# Patient Record
Sex: Male | Born: 1940 | Race: White | Hispanic: No | Marital: Married | Smoking: Never smoker
Health system: Southern US, Community
[De-identification: ages and names within clinical notes are randomized; demographics above are authoritative.]

## PROBLEM LIST (undated history)

## (undated) DIAGNOSIS — J45909 Unspecified asthma, uncomplicated: Secondary | ICD-10-CM

## (undated) DIAGNOSIS — F329 Major depressive disorder, single episode, unspecified: Secondary | ICD-10-CM

## (undated) DIAGNOSIS — K56609 Unspecified intestinal obstruction, unspecified as to partial versus complete obstruction: Secondary | ICD-10-CM

## (undated) DIAGNOSIS — E785 Hyperlipidemia, unspecified: Secondary | ICD-10-CM

## (undated) DIAGNOSIS — F32A Depression, unspecified: Secondary | ICD-10-CM

## (undated) HISTORY — DX: Depression, unspecified: F32.A

## (undated) HISTORY — DX: Unspecified asthma, uncomplicated: J45.909

## (undated) HISTORY — DX: Unspecified intestinal obstruction, unspecified as to partial versus complete obstruction: K56.609

## (undated) HISTORY — DX: Hyperlipidemia, unspecified: E78.5

## (undated) HISTORY — DX: Major depressive disorder, single episode, unspecified: F32.9

## (undated) HISTORY — PX: TONSILLECTOMY: SUR1361

---

## 1996-12-17 HISTORY — PX: OTHER SURGICAL HISTORY: SHX169

## 2004-02-14 ENCOUNTER — Emergency Department (HOSPITAL_COMMUNITY): Admission: EM | Admit: 2004-02-14 | Discharge: 2004-02-14 | Payer: Self-pay | Admitting: Emergency Medicine

## 2004-04-17 ENCOUNTER — Encounter (INDEPENDENT_AMBULATORY_CARE_PROVIDER_SITE_OTHER): Payer: Self-pay | Admitting: *Deleted

## 2004-04-17 ENCOUNTER — Ambulatory Visit (HOSPITAL_COMMUNITY): Admission: RE | Admit: 2004-04-17 | Discharge: 2004-04-17 | Payer: Self-pay | Admitting: General Surgery

## 2004-04-17 ENCOUNTER — Ambulatory Visit (HOSPITAL_BASED_OUTPATIENT_CLINIC_OR_DEPARTMENT_OTHER): Admission: RE | Admit: 2004-04-17 | Discharge: 2004-04-17 | Payer: Self-pay | Admitting: General Surgery

## 2004-04-17 HISTORY — PX: CYST REMOVAL TRUNK: SHX6283

## 2009-01-10 ENCOUNTER — Ambulatory Visit: Payer: Self-pay | Admitting: Orthopedic Surgery

## 2009-01-18 ENCOUNTER — Inpatient Hospital Stay: Payer: Self-pay | Admitting: Orthopedic Surgery

## 2010-05-27 ENCOUNTER — Ambulatory Visit: Payer: Self-pay | Admitting: Vascular Surgery

## 2010-05-27 ENCOUNTER — Encounter (INDEPENDENT_AMBULATORY_CARE_PROVIDER_SITE_OTHER): Payer: Self-pay | Admitting: Emergency Medicine

## 2010-05-27 ENCOUNTER — Emergency Department (HOSPITAL_COMMUNITY): Admission: EM | Admit: 2010-05-27 | Discharge: 2010-05-27 | Payer: Self-pay | Admitting: Emergency Medicine

## 2011-05-04 NOTE — Op Note (Signed)
NAME:  Tim Collier, MENNENGA NO.:  [id number]   MEDICAL RECORD NO.:  [id number]                   PATIENT TYPE:  AMB   LOCATION:  DSC                                  FACILITY:  [facility]   PHYSICIAN:  Ollen Gross. Vernell Morgans, M.D.              DATE OF BIRTH:  07/07/1941   DATE OF PROCEDURE:  04/17/2004  DATE OF DISCHARGE:                                 OPERATIVE REPORT   PREOPERATIVE DIAGNOSES:  Sebaceous cyst times two on the back, approximately  2.5 cm each.   POSTOPERATIVE DIAGNOSES:  Sebaceous cyst times two on the back,  approximately 2.5 cm each.   OPERATION PERFORMED:  Excision of sebaceous cyst times two on the back.   SURGEON:  Ollen Gross. Carolynne Edouard, M.D.   ANESTHESIA:  General via LMA.   DESCRIPTION OF PROCEDURE:  After informed consent was obtained, the patient  was brought to the operating room and placed in supine position on the  operating table.  After adequate induction of general anesthesia, the  patient was placed into a left side up lateral position with the bean bag.  All pressure points were padded.  His back was then prepped with Betadine  and draped in the usual sterile manner.  Each of the areas was then excised  in an elliptical fashion all the way down to healthy tissue  circumferentially.  This was done sharply with a 15 blade knife.  Hemostasis  was achieved using the Bovie electrocautery.  The two incisions were then  closed with a combination of interrupted vertical mattress stitches as well  as interrupted stitches of 2-0 nylon.  Neosporin and sterile dressings were  applied.  The patient tolerated the procedure well.  At the end of the case  all sponge, needle and instrument counts were correct.  The patient was  awakened and taken to the recovery room in stable condition.                                               Ollen Gross. Vernell Morgans, M.D.    PST/MEDQ  D:  04/17/2004  T:  04/17/2004  Job:  [id number]

## 2012-10-08 ENCOUNTER — Ambulatory Visit
Admission: RE | Admit: 2012-10-08 | Discharge: 2012-10-08 | Disposition: A | Discharge status: Home / Self Care | Payer: Medicare Other | Source: Ambulatory Visit | Attending: Allergy | Admitting: Allergy

## 2012-10-08 ENCOUNTER — Other Ambulatory Visit: Payer: Self-pay | Admitting: Allergy

## 2012-10-08 DIAGNOSIS — J45909 Unspecified asthma, uncomplicated: Secondary | ICD-10-CM

## 2013-04-13 ENCOUNTER — Encounter: Payer: Self-pay | Admitting: Family Medicine

## 2013-04-14 ENCOUNTER — Telehealth: Payer: Self-pay | Admitting: Gastroenterology

## 2013-04-14 NOTE — Telephone Encounter (Signed)
Pts son concerned about his father, states he is not doing well and has a family history of stomach cancer. Pt is having some abdominal pain and does not have much of an appetite. Requesting he be seen sooner than 1st available due to rapid decline. Pt scheduled to see Doug Sou PA tomorrow at 1:30pm. Son to notify pt of appt date and time.

## 2013-04-15 ENCOUNTER — Encounter: Payer: Self-pay | Admitting: Gastroenterology

## 2013-04-15 ENCOUNTER — Ambulatory Visit (INDEPENDENT_AMBULATORY_CARE_PROVIDER_SITE_OTHER): Payer: Medicare Other | Admitting: Gastroenterology

## 2013-04-15 ENCOUNTER — Other Ambulatory Visit (INDEPENDENT_AMBULATORY_CARE_PROVIDER_SITE_OTHER): Payer: Medicare Other

## 2013-04-15 VITALS — BP 124/70 | HR 68 | Ht 69.0 in | Wt 187.4 lb

## 2013-04-15 DIAGNOSIS — Z1211 Encounter for screening for malignant neoplasm of colon: Secondary | ICD-10-CM

## 2013-04-15 DIAGNOSIS — R109 Unspecified abdominal pain: Secondary | ICD-10-CM

## 2013-04-15 DIAGNOSIS — Z800 Family history of malignant neoplasm of digestive organs: Secondary | ICD-10-CM

## 2013-04-15 DIAGNOSIS — R141 Gas pain: Secondary | ICD-10-CM

## 2013-04-15 DIAGNOSIS — R142 Eructation: Secondary | ICD-10-CM

## 2013-04-15 DIAGNOSIS — R143 Flatulence: Secondary | ICD-10-CM

## 2013-04-15 LAB — CBC WITH DIFFERENTIAL/PLATELET
Basophils Absolute: 0 10*3/uL (ref 0.0–0.1)
Eosinophils Absolute: 0.5 10*3/uL (ref 0.0–0.7)
Lymphocytes Relative: 25.4 % (ref 12.0–46.0)
MCHC: 34.3 g/dL (ref 30.0–36.0)
Platelets: 316 10*3/uL (ref 150.0–400.0)
WBC: 6.9 10*3/uL (ref 4.5–10.5)

## 2013-04-15 LAB — COMPREHENSIVE METABOLIC PANEL
AST: 36 U/L (ref 0–37)
Alkaline Phosphatase: 80 U/L (ref 39–117)
BUN: 11 mg/dL (ref 6–23)
Calcium: 9.4 mg/dL (ref 8.4–10.5)
Creatinine, Ser: 0.8 mg/dL (ref 0.4–1.5)
GFR: 98.89 mL/min (ref >60.00)
Glucose, Bld: 82 mg/dL (ref 70–99)
Sodium: 136 mEq/L (ref 135–145)

## 2013-04-15 MED ORDER — OMEPRAZOLE MAGNESIUM 20 MG PO TBEC: 20.0000 mg | DELAYED_RELEASE_TABLET | Freq: Every day | ORAL | Status: AC

## 2013-04-15 MED ORDER — NA SULFATE-K SULFATE-MG SULF 17.5-3.13-1.6 GM/177ML PO SOLN: ORAL | Status: DC

## 2013-04-15 NOTE — Progress Notes (Signed)
04/15/2013 Tim Collier [id number] January 27, 1941   HISTORY OF PRESENT ILLNESS:  Patient is a pleasant 72 year old male who is a new patient to our practice.  He presents to our office today with complaints of abdominal pain, belching, and loss of appetite.  Says that this all started about a week ago with a sour stomach, some discomfort, and nausea out of nowhere.  No vomiting.  Bloating and not a great appetite.  Pain/discomfort is in his epigastrium/ mid-abdomen.  Says that he never has any GI issues and he just took some simethicone and let it go for a few days.  Says that it is not getting better and is concerned because his father had stomach cancer.  Says that his BM have increased in frequency as well but are solid, no diarrhea.  No dark or bloody stool, fevers or chills.  No difficult or painful swallowing.  Never had a colonoscopy either.  Symptoms do not keep him from sleep.  Has history of bowel obstruction requiring surgery in the late 1990's, but unsure of the exact cause.   Past Medical History  Diagnosis Date  . Asthma   . Depression   . HLD (hyperlipidemia)   . Bowel obstruction    Past Surgical History  Procedure Laterality Date  . Cyst removal trunk  04/17/2004    Back x 2, epidermal inclusion cyst  . Bowel obstruction surgery  1998  . Tonsillectomy      reports that he has never smoked. His smokeless tobacco use includes Chew. He reports that  drinks alcohol. He reports that he does not use illicit drugs. family history includes Heart disease in his father; Hypertension in his mother; Liver cancer in his father; and Stomach cancer in his father. No Known Allergies    Outpatient Encounter Prescriptions as of 04/15/2013  Medication Sig Dispense Refill  . aspirin 81 MG tablet Take 81 mg by mouth daily.      . cetirizine (ZYRTEC) 10 MG tablet Take 10 mg by mouth daily.      Marland Kitchen escitalopram (LEXAPRO) 20 MG tablet Take 20 mg by mouth daily.      . rosuvastatin (CRESTOR) 20 MG  tablet Take 20 mg by mouth daily.       No facility-administered encounter medications on file as of 04/15/2013.     REVIEW OF SYSTEMS  : All other systems reviewed and negative except where noted in the History of Present Illness.   PHYSICAL EXAM: BP 124/70  Pulse 68  Ht 5\' 9"  (1.753 m)  Wt 187 lb 6 oz (84.993 kg)  BMI 27.66 kg/m2 General: Well developed white male in no acute distress Head: Normocephalic and atraumatic Eyes:  sclerae anicteric,conjunctive pink. Ears: Normal auditory acuity Lungs: Clear throughout to auscultation Heart: Regular rate and rhythm Abdomen: Soft, non-distended. No masses or hepatomegaly noted. Normal bowel sounds.  Laparotomy scar noted.  Mild mid-abdominal TTP noted.  Reducible ventral/incisional hernia noted without pain. Rectal:  Deferred.  Will be performed at the time of colonoscopy. Musculoskeletal: Symmetrical with no gross deformities  Skin: No lesions on visible extremities Extremities: No edema  Neurological: Alert oriented x 4, grossly nonfocal Psychological:  Alert and cooperative. Normal mood and affect  ASSESSMENT AND PLAN: -Screening colonoscopy:  Will schedule. -Family history of stomach  -Abdominal pain and belching:  Schedule EGD as well.  Patient is concerned to due his family history.  Will check labs as well including a CBC, CMP, and lipase.  Will start prilosec 20 mg daily in the interim.

## 2013-04-15 NOTE — Patient Instructions (Addendum)
You have been given a separate informational sheet regarding your tobacco use, the importance of quitting and local resources to help you quit.  You have been scheduled for an endoscopy and colonoscopy with propofol. Please follow the written instructions given to you at your visit today. Please use the sample kit you have been given today. (suprep). If you use inhalers (even only as needed), please bring them with you on the day of your procedure. Your physician has requested that you go to www.startemmi.com and enter the access code given to you at your visit today. This web site gives a general overview about your procedure. However, you should still follow specific instructions given to you by our office regarding your preparation for the procedure.  Your physician has requested that you go to the basement for the following lab work before leaving today: CBC/diff, CMET, lipase  Please take Prilosec OTC one daily 30 minutes before breakfast.  We are giving you samples and a coupon to purchase over the counter.  Thank you for choosing me and [facility].  Doug Sou, PA-C

## 2013-04-16 ENCOUNTER — Encounter: Payer: Self-pay | Admitting: Gastroenterology

## 2013-04-16 NOTE — Progress Notes (Signed)
Reviewed and agree with management. Darryel Diodato D. Alexias Margerum, M.D., FACG  

## 2013-04-23 ENCOUNTER — Ambulatory Visit (AMBULATORY_SURGERY_CENTER): Payer: Medicare Other | Admitting: Gastroenterology

## 2013-04-23 ENCOUNTER — Encounter: Payer: Self-pay | Admitting: Gastroenterology

## 2013-04-23 VITALS — BP 141/58 | HR 67 | Temp 97.5°F | Resp 22 | Ht 69.0 in | Wt 187.0 lb

## 2013-04-23 DIAGNOSIS — R109 Unspecified abdominal pain: Secondary | ICD-10-CM

## 2013-04-23 MED ORDER — SODIUM CHLORIDE 0.9 % IV SOLN: 500.0000 mL | INTRAVENOUS | Status: DC

## 2013-04-23 NOTE — Patient Instructions (Addendum)
YOU HAD AN ENDOSCOPIC PROCEDURE TODAY AT THE [facility]: Refer to the procedure report that was given to you for any specific questions about what was found during the examination.  If the procedure report does not answer your questions, please call your gastroenterologist to clarify.  If you requested that your care partner not be given the details of your procedure findings, then the procedure report has been included in a sealed envelope for you to review at your convenience later.  YOU SHOULD EXPECT: Some feelings of bloating in the abdomen. Passage of more gas than usual.  Walking can help get rid of the air that was put into your GI tract during the procedure and reduce the bloating. If you had a lower endoscopy (such as a colonoscopy or flexible sigmoidoscopy) you may notice spotting of blood in your stool or on the toilet paper. If you underwent a bowel prep for your procedure, then you may not have a normal bowel movement for a few days.  DIET: Your first meal following the procedure should be a light meal and then it is ok to progress to your normal diet.  A half-sandwich or bowl of soup is an example of a good first meal.  Heavy or fried foods are harder to digest and may make you feel nauseous or bloated.  Likewise meals heavy in dairy and vegetables can cause extra gas to form and this can also increase the bloating.  Drink plenty of fluids but you should avoid alcoholic beverages for 24 hours.  ACTIVITY: Your care partner should take you home directly after the procedure.  You should plan to take it easy, moving slowly for the rest of the day.  You can resume normal activity the day after the procedure however you should NOT DRIVE or use heavy machinery for 24 hours (because of the sedation medicines used during the test).    SYMPTOMS TO REPORT IMMEDIATELY: A gastroenterologist can be reached at any hour.  During normal business hours, 8:30 AM to 5:00 PM Monday through Friday,  call [phone].  After hours and on weekends, please call the GI answering service at [phone] who will take a message and have the physician on call contact you.  Following upper endoscopy (EGD)  Vomiting of blood or coffee ground material  New chest pain or pain under the shoulder blades  Painful or persistently difficult swallowing  New shortness of breath  Fever of 100F or higher  Black, tarry-looking stools  FOLLOW UP: If any biopsies were taken you will be contacted by phone or by letter within the next 1-3 weeks.  Call your gastroenterologist if you have not heard about the biopsies in 3 weeks.  Our staff will call the home number listed on your records the next business day following your procedure to check on you and address any questions or concerns that you may have at that time regarding the information given to you following your procedure. This is a courtesy call and so if there is no answer at the home number and we have not heard from you through the emergency physician on call, we will assume that you have returned to your regular daily activities without incident.  SIGNATURES/CONFIDENTIALITY: You and/or your care partner have signed paperwork which will be entered into your electronic medical record.  These signatures attest to the fact that that the information above on your After Visit Summary has been reviewed and is understood.  Full responsibility of   the confidentiality of this discharge information lies with you and/or your care-partner. 

## 2013-04-23 NOTE — Op Note (Signed)
[facility] [address] [place], [address]   ENDOSCOPY PROCEDURE REPORT  PATIENT: Tim Collier, Tim Collier  MR#: [id number] BIRTHDATE: [birth year] , 71  yrs. old GENDER: Male ENDOSCOPIST: Louis Meckel, MD REFERRED BY: PROCEDURE DATE:  04/23/2013 PROCEDURE:  EGD, diagnostic ASA CLASS:     Class II INDICATIONS:  Epigastric pain. MEDICATIONS: MAC sedation, administered by CRNA, propofol (Diprivan) 100mg  IV, and Simethicone 0.6cc PO TOPICAL ANESTHETIC: Cetacaine Spray  DESCRIPTION OF PROCEDURE: After the risks benefits and alternatives of the procedure were thoroughly explained, informed consent was obtained.  The LB GIF-H180 [id number] endoscope was introduced through the mouth and advanced to the third portion of the duodenum. Without limitations.  The instrument was slowly withdrawn as the mucosa was fully examined.      The upper, middle and distal third of the esophagus were carefully inspected and no abnormalities were noted.  The z-line was well seen at the GEJ.  The endoscope was pushed into the fundus which was normal including a retroflexed view.  The antrum, gastric body, first and second part of the duodenum were unremarkable. Retroflexed views revealed no abnormalities.     The scope was then withdrawn from the patient and the procedure completed.  COMPLICATIONS: There were no complications. ENDOSCOPIC IMPRESSION: Normal EGD  RECOMMENDATIONS: No further GI workup or therapy since abdominal pain has recently subsided  REPEAT EXAM:  eSigned:  Louis Meckel, MD 04/23/2013 4:05 PM   CC:

## 2013-04-23 NOTE — Progress Notes (Signed)
Patient did not have preoperative order for IV antibiotic SSI prophylaxis. (G8918)  Patient did not experience any of the following events: a burn prior to discharge; a fall within the facility; wrong site/side/patient/procedure/implant event; or a hospital transfer or hospital admission upon discharge from the facility. (G8907)  

## 2013-04-24 ENCOUNTER — Telehealth: Payer: Self-pay | Admitting: *Deleted

## 2013-04-24 NOTE — Telephone Encounter (Signed)
  Follow up Call-  Call back number 04/23/2013  Post procedure Call Back phone  # [phone]  Permission to leave phone message Yes     Patient questions:  Do you have a fever, pain , or abdominal swelling? no Pain Score  0 *  Have you tolerated food without any problems? yes  Have you been able to return to your normal activities? yes  Do you have any questions about your discharge instructions: Diet   no Medications  no Follow up visit  no  Do you have questions or concerns about your Care? no  Actions: * If pain score is 4 or above: No action needed, pain <4.

## 2013-04-28 ENCOUNTER — Encounter: Payer: Self-pay | Admitting: Gastroenterology

## 2013-04-28 ENCOUNTER — Ambulatory Visit (AMBULATORY_SURGERY_CENTER): Payer: Medicare Other | Admitting: Gastroenterology

## 2013-04-28 VITALS — BP 142/74 | HR 72 | Temp 97.1°F | Resp 16 | Ht 69.0 in | Wt 187.0 lb

## 2013-04-28 DIAGNOSIS — Z1211 Encounter for screening for malignant neoplasm of colon: Secondary | ICD-10-CM

## 2013-04-28 DIAGNOSIS — D126 Benign neoplasm of colon, unspecified: Secondary | ICD-10-CM

## 2013-04-28 MED ORDER — SODIUM CHLORIDE 0.9 % IV SOLN: 500.0000 mL | INTRAVENOUS | Status: DC

## 2013-04-28 NOTE — Progress Notes (Signed)
Lidocaine-40mg IV prior to Propofol InductionPropofol given over incremental dosages 

## 2013-04-28 NOTE — Patient Instructions (Signed)
YOU HAD AN ENDOSCOPIC PROCEDURE TODAY AT THE [facility]: Refer to the procedure report that was given to you for any specific questions about what was found during the examination.  If the procedure report does not answer your questions, please call your gastroenterologist to clarify.  If you requested that your care partner not be given the details of your procedure findings, then the procedure report has been included in a sealed envelope for you to review at your convenience later.  YOU SHOULD EXPECT: Some feelings of bloating in the abdomen. Passage of more gas than usual.  Walking can help get rid of the air that was put into your GI tract during the procedure and reduce the bloating. If you had a lower endoscopy (such as a colonoscopy or flexible sigmoidoscopy) you may notice spotting of blood in your stool or on the toilet paper. If you underwent a bowel prep for your procedure, then you may not have a normal bowel movement for a few days.  DIET: Your first meal following the procedure should be a light meal and then it is ok to progress to your normal diet.  A half-sandwich or bowl of soup is an example of a good first meal.  Heavy or fried foods are harder to digest and may make you feel nauseous or bloated.  Likewise meals heavy in dairy and vegetables can cause extra gas to form and this can also increase the bloating.  Drink plenty of fluids but you should avoid alcoholic beverages for 24 hours.  ACTIVITY: Your care partner should take you home directly after the procedure.  You should plan to take it easy, moving slowly for the rest of the day.  You can resume normal activity the day after the procedure however you should NOT DRIVE or use heavy machinery for 24 hours (because of the sedation medicines used during the test).    SYMPTOMS TO REPORT IMMEDIATELY: A gastroenterologist can be reached at any hour.  During normal business hours, 8:30 AM to 5:00 PM Monday through Friday,  call [phone].  After hours and on weekends, please call the GI answering service at [phone] who will take a message and have the physician on call contact you.   Following lower endoscopy (colonoscopy or flexible sigmoidoscopy):  Excessive amounts of blood in the stool  Significant tenderness or worsening of abdominal pains  Swelling of the abdomen that is new, acute  Fever of 100F or higher    FOLLOW UP: If any biopsies were taken you will be contacted by phone or by letter within the next 1-3 weeks.  Call your gastroenterologist if you have not heard about the biopsies in 3 weeks.  Our staff will call the home number listed on your records the next business day following your procedure to check on you and address any questions or concerns that you may have at that time regarding the information given to you following your procedure. This is a courtesy call and so if there is no answer at the home number and we have not heard from you through the emergency physician on call, we will assume that you have returned to your regular daily activities without incident.  SIGNATURES/CONFIDENTIALITY: You and/or your care partner have signed paperwork which will be entered into your electronic medical record.  These signatures attest to the fact that that the information above on your After Visit Summary has been reviewed and is understood.  Full responsibility of the confidentiality   of this discharge information lies with you and/or your care-partner.     

## 2013-04-28 NOTE — Progress Notes (Signed)
Patient did not experience any of the following events: a burn prior to discharge; a fall within the facility; wrong site/side/patient/procedure/implant event; or a hospital transfer or hospital admission upon discharge from the facility. (G8907) Patient did not have preoperative order for IV antibiotic SSI prophylaxis. (G8918)  

## 2013-04-28 NOTE — Op Note (Signed)
[facility] [address] [place], [address]   COLONOSCOPY PROCEDURE REPORT  PATIENT: Tim Collier, Tim Collier  MR#: [id number] BIRTHDATE: 10-Aug-1941 , 71  yrs. old GENDER: Male ENDOSCOPIST: Louis Meckel, MD REFERRED XB:JYNW Kadakia, M.D. PROCEDURE DATE:  04/28/2013 PROCEDURE:   Colonoscopy with cold biopsy polypectomy ASA CLASS:   Class II INDICATIONS:average risk screening. MEDICATIONS: MAC sedation, administered by CRNA and propofol (Diprivan) 150mg  IV  DESCRIPTION OF PROCEDURE:   After the risks benefits and alternatives of the procedure were thoroughly explained, informed consent was obtained.  A digital rectal exam revealed no abnormalities of the rectum.   The     endoscope was introduced through the anus and advanced to the cecum, which was identified by both the appendix and ileocecal valve. No adverse events experienced.   The quality of the prep was Suprep fair  The instrument was then slowly withdrawn as the colon was fully examined.      COLON FINDINGS: A sessile polyp was found at the cecum.  A polypectomy was performed with cold forceps.  The resection was complete and the polyp tissue was completely retrieved.   The colon mucosa was otherwise normal.  Retroflexed views revealed no abnormalities. The time to cecum=4 minutes 03 seconds.  Withdrawal time=11 minutes 03 seconds.  The scope was withdrawn and the procedure completed. COMPLICATIONS: There were no complications.  ENDOSCOPIC IMPRESSION: 1.   Sessile polyp was found at the cecum; polypectomy was performed with cold forceps 2.   The colon mucosa was otherwise normal  RECOMMENDATIONS: If the polyp(s) removed today are proven to be adenomatous (pre-cancerous) polyps, you will need a repeat colonoscopy in 5 years.  Otherwise you should continue to follow colorectal cancer screening guidelines for "routine risk" patients with colonoscopy in 10 years.  You will receive a letter within  1-2 weeks with the results of your biopsy as well as final recommendations.  Please call my office if you have not received a letter after 3 weeks.   eSigned:  Louis Meckel, MD 04/28/2013 3:09 PM   cc:

## 2013-04-29 ENCOUNTER — Telehealth: Payer: Self-pay | Admitting: *Deleted

## 2013-04-29 NOTE — Telephone Encounter (Signed)
  Follow up Call-  Call back number 04/28/2013 04/23/2013  Post procedure Call Back phone  # [phone]  Permission to leave phone message Yes Yes     Patient questions:  Do you have a fever, pain , or abdominal swelling? no Pain Score  0 *  Have you tolerated food without any problems? yes  Have you been able to return to your normal activities? yes  Do you have any questions about your discharge instructions: Diet   no Medications  no Follow up visit  no  Do you have questions or concerns about your Care? no  Actions: * If pain score is 4 or above: No action needed, pain <4.

## 2013-05-05 ENCOUNTER — Ambulatory Visit: Payer: Medicare Other | Admitting: Gastroenterology

## 2013-05-12 ENCOUNTER — Encounter: Payer: Self-pay | Admitting: Gastroenterology

## 2013-05-25 ENCOUNTER — Ambulatory Visit: Payer: Medicare Other | Admitting: Gastroenterology

## 2013-07-07 ENCOUNTER — Ambulatory Visit: Payer: Medicare Other | Admitting: Dietician

## 2014-10-23 IMAGING — CR DG CHEST 2V
2 series · 2 of 2 positions shown · non-contrast
Comparison: None.

CLINICAL DATA: Extrinsic asthma, cough, congestion

CHEST - 2 VIEW

[view not recorded (1 of 2)]
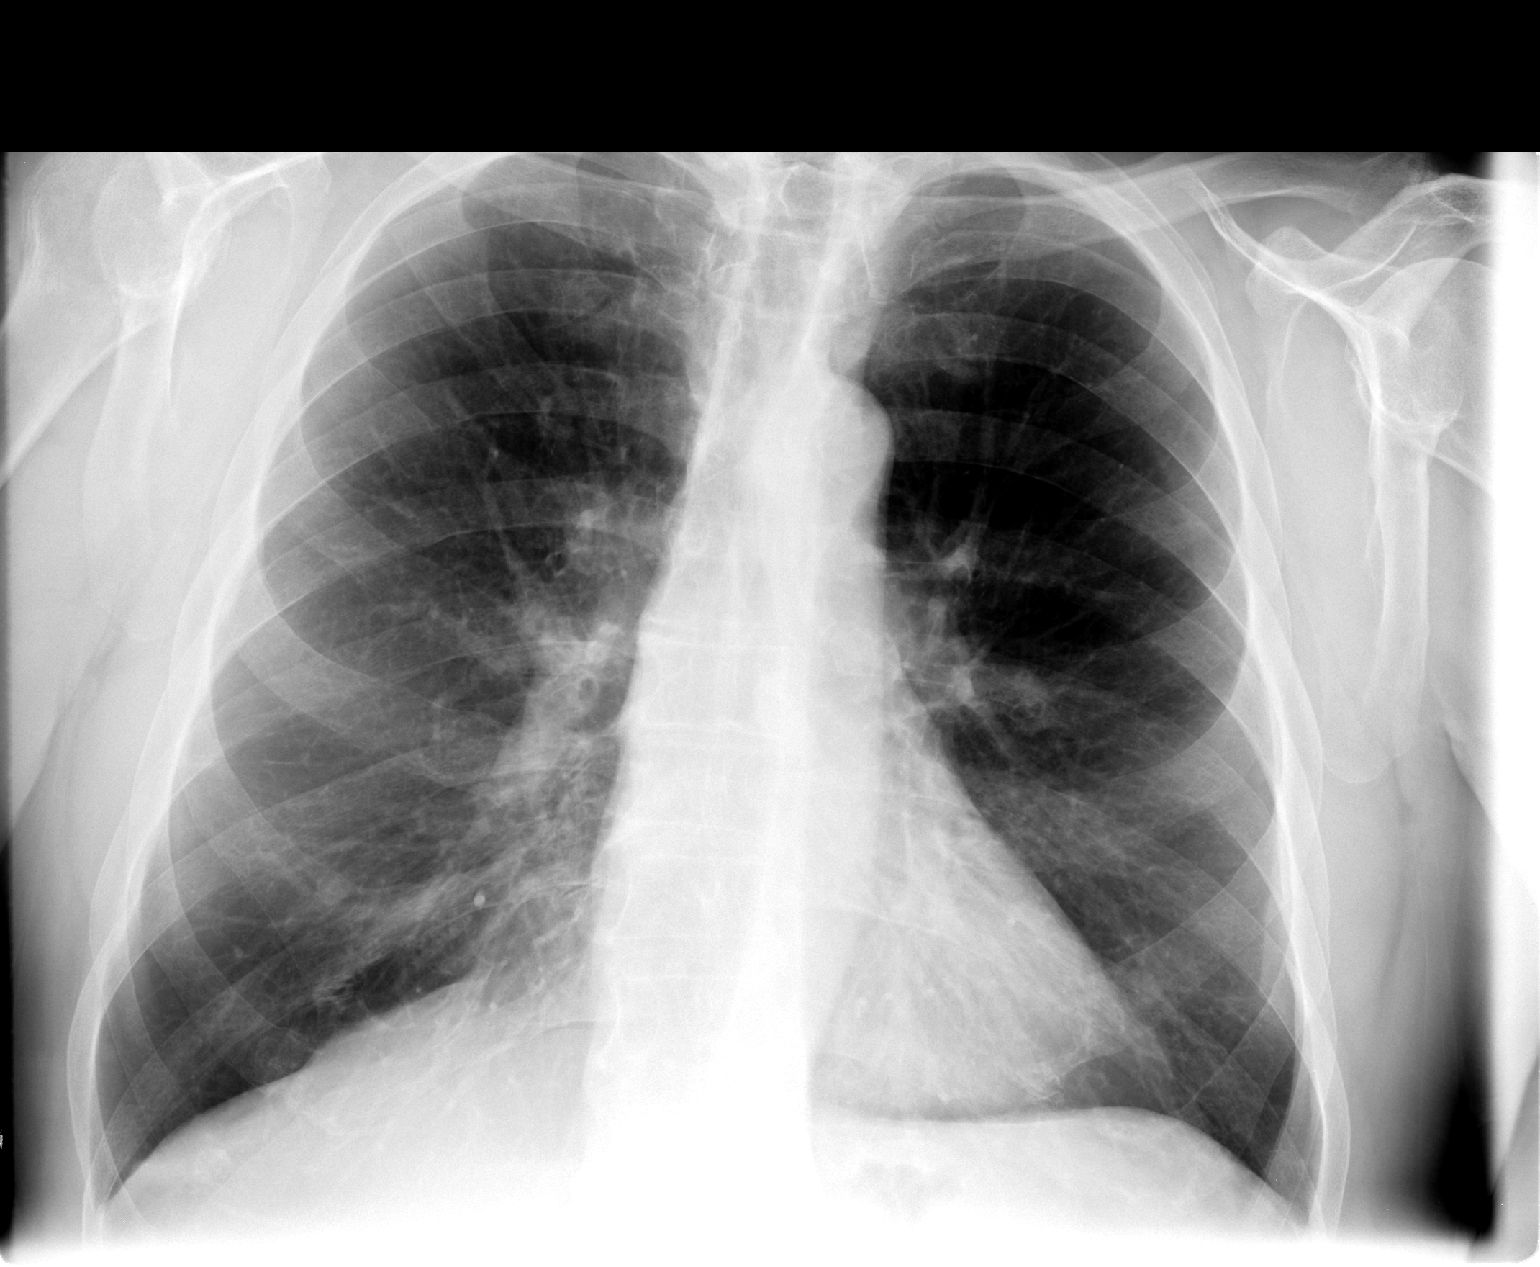

[view not recorded (2 of 2)]
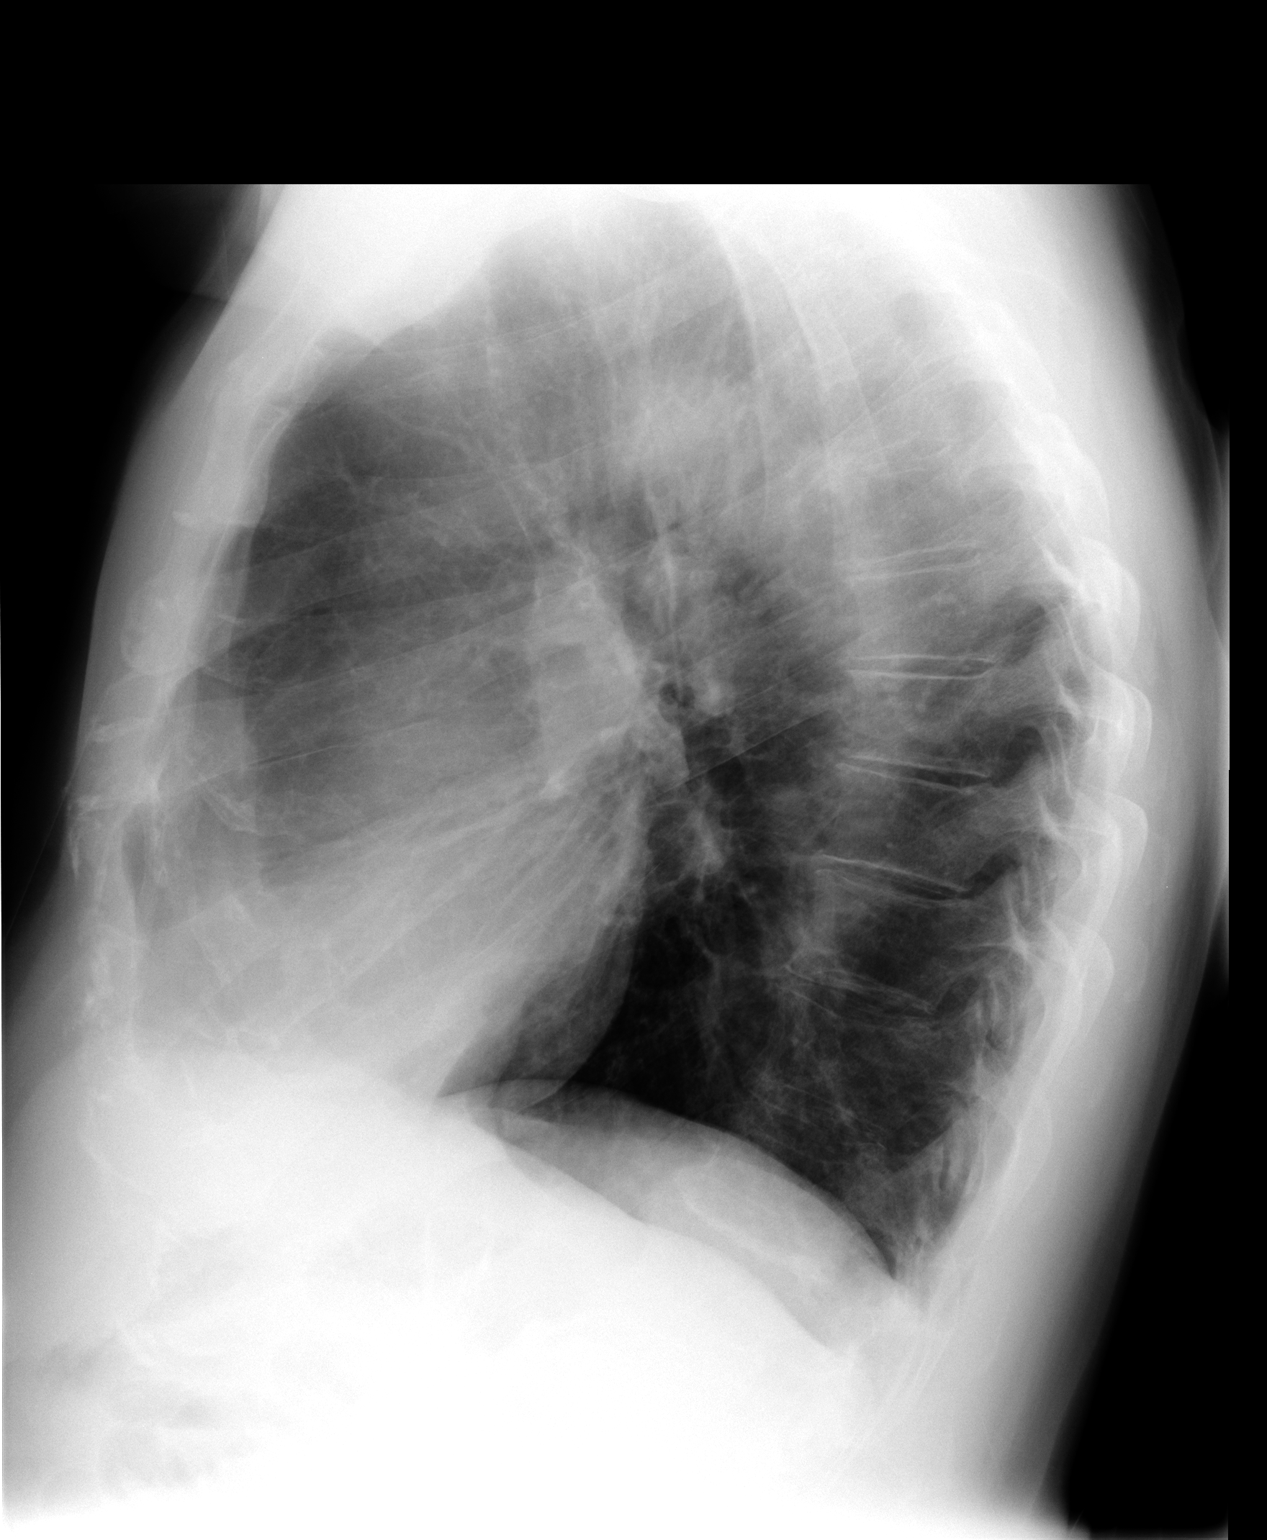

[2 of 2 positions shown; findings below may reference images not displayed]

FINDINGS: The lungs are clear but hyperaerated.  Mediastinal
contours appear normal.  The heart is within normal limits in size.
No acute bony abnormality is noted.
IMPRESSION: No active lung disease.  Slight hyperaeration.

## 2020-01-08 ENCOUNTER — Ambulatory Visit: Payer: Self-pay | Attending: Internal Medicine

## 2020-01-08 DIAGNOSIS — Z23 Encounter for immunization: Secondary | ICD-10-CM | POA: Insufficient documentation

## 2020-01-08 NOTE — Progress Notes (Signed)
   Covid-19 Vaccination Clinic  Name:  Tim Collier    MRN: [id number] DOB: February 16, 1941  01/08/2020  Mr. [name] was observed post Covid-19 immunization for 15 minutes without incidence. He was provided with Vaccine Information Sheet and instruction to access the V-Safe system.   [name] was instructed to call 911 with any severe reactions post vaccine: Marland Kitchen Difficulty breathing  . Swelling of your face and throat  . A fast heartbeat  . A bad rash all over your body  . Dizziness and weakness    Immunizations Administered    Name Date Dose VIS Date Route   Pfizer COVID-19 Vaccine 01/08/2020  8:59 AM 0.3 mL 11/27/2019 Intramuscular   Manufacturer: [place]   Lot: [id number]   [place]: [id number]

## 2020-01-26 ENCOUNTER — Ambulatory Visit: Payer: Self-pay | Attending: Internal Medicine

## 2020-01-26 DIAGNOSIS — Z23 Encounter for immunization: Secondary | ICD-10-CM | POA: Insufficient documentation

## 2020-01-26 NOTE — Progress Notes (Signed)
   Covid-19 Vaccination Clinic  Name:  Tim Collier    MRN: [id number] DOB: 09/27/1941  01/26/2020  Mr. Marcinek was observed post Covid-19 immunization for 15 minutes without incidence. He was provided with Vaccine Information Sheet and instruction to access the V-Safe system.   Mr. Chynoweth was instructed to call 911 with any severe reactions post vaccine: Marland Kitchen Difficulty breathing  . Swelling of your face and throat  . A fast heartbeat  . A bad rash all over your body  . Dizziness and weakness    Immunizations Administered    Name Date Dose VIS Date Route   Pfizer COVID-19 Vaccine 01/26/2020  8:28 AM 0.3 mL 11/27/2019 Intramuscular   Manufacturer: [place]   Lot: [id number]   [place]: [id number]

## 2020-07-12 DIAGNOSIS — E1169 Type 2 diabetes mellitus with other specified complication: Secondary | ICD-10-CM | POA: Diagnosis not present

## 2020-07-12 DIAGNOSIS — J45909 Unspecified asthma, uncomplicated: Secondary | ICD-10-CM | POA: Diagnosis not present

## 2020-07-12 DIAGNOSIS — F419 Anxiety disorder, unspecified: Secondary | ICD-10-CM | POA: Diagnosis not present

## 2020-07-12 DIAGNOSIS — F439 Reaction to severe stress, unspecified: Secondary | ICD-10-CM | POA: Diagnosis not present

## 2020-07-12 DIAGNOSIS — J309 Allergic rhinitis, unspecified: Secondary | ICD-10-CM | POA: Diagnosis not present

## 2020-07-12 DIAGNOSIS — E782 Mixed hyperlipidemia: Secondary | ICD-10-CM | POA: Diagnosis not present

## 2020-07-12 DIAGNOSIS — E039 Hypothyroidism, unspecified: Secondary | ICD-10-CM | POA: Diagnosis not present

## 2021-01-19 DIAGNOSIS — Z1389 Encounter for screening for other disorder: Secondary | ICD-10-CM | POA: Diagnosis not present

## 2021-01-19 DIAGNOSIS — Z0000 Encounter for general adult medical examination without abnormal findings: Secondary | ICD-10-CM | POA: Diagnosis not present

## 2021-01-19 DIAGNOSIS — E782 Mixed hyperlipidemia: Secondary | ICD-10-CM | POA: Diagnosis not present

## 2021-01-19 DIAGNOSIS — E1169 Type 2 diabetes mellitus with other specified complication: Secondary | ICD-10-CM | POA: Diagnosis not present

## 2021-01-19 DIAGNOSIS — Z9181 History of falling: Secondary | ICD-10-CM | POA: Diagnosis not present

## 2021-01-19 DIAGNOSIS — J309 Allergic rhinitis, unspecified: Secondary | ICD-10-CM | POA: Diagnosis not present

## 2021-01-19 DIAGNOSIS — J45909 Unspecified asthma, uncomplicated: Secondary | ICD-10-CM | POA: Diagnosis not present

## 2021-01-19 DIAGNOSIS — Z1211 Encounter for screening for malignant neoplasm of colon: Secondary | ICD-10-CM | POA: Diagnosis not present

## 2021-01-19 DIAGNOSIS — E039 Hypothyroidism, unspecified: Secondary | ICD-10-CM | POA: Diagnosis not present

## 2021-01-19 DIAGNOSIS — F419 Anxiety disorder, unspecified: Secondary | ICD-10-CM | POA: Diagnosis not present

## 2021-02-27 DIAGNOSIS — J4530 Mild persistent asthma, uncomplicated: Secondary | ICD-10-CM | POA: Diagnosis not present

## 2021-02-27 DIAGNOSIS — J3089 Other allergic rhinitis: Secondary | ICD-10-CM | POA: Diagnosis not present

## 2021-02-27 DIAGNOSIS — J3081 Allergic rhinitis due to animal (cat) (dog) hair and dander: Secondary | ICD-10-CM | POA: Diagnosis not present

## 2021-07-18 DIAGNOSIS — E782 Mixed hyperlipidemia: Secondary | ICD-10-CM | POA: Diagnosis not present

## 2021-07-18 DIAGNOSIS — F419 Anxiety disorder, unspecified: Secondary | ICD-10-CM | POA: Diagnosis not present

## 2021-07-18 DIAGNOSIS — J45909 Unspecified asthma, uncomplicated: Secondary | ICD-10-CM | POA: Diagnosis not present

## 2021-07-18 DIAGNOSIS — E039 Hypothyroidism, unspecified: Secondary | ICD-10-CM | POA: Diagnosis not present

## 2021-07-18 DIAGNOSIS — E1169 Type 2 diabetes mellitus with other specified complication: Secondary | ICD-10-CM | POA: Diagnosis not present

## 2021-07-18 DIAGNOSIS — J309 Allergic rhinitis, unspecified: Secondary | ICD-10-CM | POA: Diagnosis not present

## 2021-11-07 DIAGNOSIS — Z789 Other specified health status: Secondary | ICD-10-CM | POA: Diagnosis not present

## 2021-11-07 DIAGNOSIS — F329 Major depressive disorder, single episode, unspecified: Secondary | ICD-10-CM | POA: Diagnosis not present

## 2021-11-07 DIAGNOSIS — E039 Hypothyroidism, unspecified: Secondary | ICD-10-CM | POA: Diagnosis not present

## 2021-11-07 DIAGNOSIS — E785 Hyperlipidemia, unspecified: Secondary | ICD-10-CM | POA: Diagnosis not present

## 2021-11-07 DIAGNOSIS — F32A Depression, unspecified: Secondary | ICD-10-CM | POA: Diagnosis not present

## 2021-11-07 DIAGNOSIS — I10 Essential (primary) hypertension: Secondary | ICD-10-CM | POA: Diagnosis not present

## 2021-11-07 DIAGNOSIS — Z0189 Encounter for other specified special examinations: Secondary | ICD-10-CM | POA: Diagnosis not present

## 2021-11-07 DIAGNOSIS — H6122 Impacted cerumen, left ear: Secondary | ICD-10-CM | POA: Diagnosis not present

## 2021-11-07 DIAGNOSIS — Z7689 Persons encountering health services in other specified circumstances: Secondary | ICD-10-CM | POA: Diagnosis not present

## 2021-11-30 DIAGNOSIS — J4540 Moderate persistent asthma, uncomplicated: Secondary | ICD-10-CM | POA: Diagnosis not present

## 2021-11-30 DIAGNOSIS — J3089 Other allergic rhinitis: Secondary | ICD-10-CM | POA: Diagnosis not present

## 2021-11-30 DIAGNOSIS — W57XXXS Bitten or stung by nonvenomous insect and other nonvenomous arthropods, sequela: Secondary | ICD-10-CM | POA: Diagnosis not present

## 2022-03-08 DIAGNOSIS — K439 Ventral hernia without obstruction or gangrene: Secondary | ICD-10-CM | POA: Diagnosis not present

## 2022-03-08 DIAGNOSIS — N401 Enlarged prostate with lower urinary tract symptoms: Secondary | ICD-10-CM | POA: Diagnosis not present

## 2022-03-08 DIAGNOSIS — R351 Nocturia: Secondary | ICD-10-CM | POA: Diagnosis not present

## 2022-07-19 DIAGNOSIS — E785 Hyperlipidemia, unspecified: Secondary | ICD-10-CM | POA: Diagnosis not present

## 2022-07-19 DIAGNOSIS — E039 Hypothyroidism, unspecified: Secondary | ICD-10-CM | POA: Diagnosis not present

## 2022-07-19 DIAGNOSIS — I10 Essential (primary) hypertension: Secondary | ICD-10-CM | POA: Diagnosis not present

## 2022-07-20 DIAGNOSIS — E785 Hyperlipidemia, unspecified: Secondary | ICD-10-CM | POA: Diagnosis not present

## 2022-07-20 DIAGNOSIS — Z0000 Encounter for general adult medical examination without abnormal findings: Secondary | ICD-10-CM | POA: Diagnosis not present

## 2022-07-20 DIAGNOSIS — N401 Enlarged prostate with lower urinary tract symptoms: Secondary | ICD-10-CM | POA: Diagnosis not present

## 2022-07-20 DIAGNOSIS — Z79899 Other long term (current) drug therapy: Secondary | ICD-10-CM | POA: Diagnosis not present

## 2022-07-20 DIAGNOSIS — F32A Depression, unspecified: Secondary | ICD-10-CM | POA: Diagnosis not present

## 2022-07-20 DIAGNOSIS — I10 Essential (primary) hypertension: Secondary | ICD-10-CM | POA: Diagnosis not present

## 2022-07-20 DIAGNOSIS — E119 Type 2 diabetes mellitus without complications: Secondary | ICD-10-CM | POA: Diagnosis not present

## 2022-07-20 DIAGNOSIS — E039 Hypothyroidism, unspecified: Secondary | ICD-10-CM | POA: Diagnosis not present

## 2022-07-20 DIAGNOSIS — J45909 Unspecified asthma, uncomplicated: Secondary | ICD-10-CM | POA: Diagnosis not present

## 2023-08-13 DIAGNOSIS — E119 Type 2 diabetes mellitus without complications: Secondary | ICD-10-CM | POA: Diagnosis not present

## 2023-08-13 DIAGNOSIS — E039 Hypothyroidism, unspecified: Secondary | ICD-10-CM | POA: Diagnosis not present

## 2023-08-20 DIAGNOSIS — E785 Hyperlipidemia, unspecified: Secondary | ICD-10-CM | POA: Diagnosis not present

## 2023-08-20 DIAGNOSIS — E119 Type 2 diabetes mellitus without complications: Secondary | ICD-10-CM | POA: Diagnosis not present

## 2023-08-20 DIAGNOSIS — I10 Essential (primary) hypertension: Secondary | ICD-10-CM | POA: Diagnosis not present

## 2023-08-20 DIAGNOSIS — R351 Nocturia: Secondary | ICD-10-CM | POA: Diagnosis not present

## 2023-08-20 DIAGNOSIS — Z0000 Encounter for general adult medical examination without abnormal findings: Secondary | ICD-10-CM | POA: Diagnosis not present

## 2023-08-20 DIAGNOSIS — N401 Enlarged prostate with lower urinary tract symptoms: Secondary | ICD-10-CM | POA: Diagnosis not present

## 2023-08-20 DIAGNOSIS — E039 Hypothyroidism, unspecified: Secondary | ICD-10-CM | POA: Diagnosis not present

## 2023-08-20 DIAGNOSIS — Z23 Encounter for immunization: Secondary | ICD-10-CM | POA: Diagnosis not present

## 2023-08-20 DIAGNOSIS — F32A Depression, unspecified: Secondary | ICD-10-CM | POA: Diagnosis not present

## 2023-10-24 ENCOUNTER — Encounter: Payer: Self-pay | Admitting: Gastroenterology

## 2024-02-20 DIAGNOSIS — Z0000 Encounter for general adult medical examination without abnormal findings: Secondary | ICD-10-CM | POA: Diagnosis not present

## 2024-02-20 DIAGNOSIS — E039 Hypothyroidism, unspecified: Secondary | ICD-10-CM | POA: Diagnosis not present

## 2024-02-20 DIAGNOSIS — I10 Essential (primary) hypertension: Secondary | ICD-10-CM | POA: Diagnosis not present

## 2024-02-20 DIAGNOSIS — F32A Depression, unspecified: Secondary | ICD-10-CM | POA: Diagnosis not present

## 2024-02-20 DIAGNOSIS — E785 Hyperlipidemia, unspecified: Secondary | ICD-10-CM | POA: Diagnosis not present

## 2024-02-20 DIAGNOSIS — E119 Type 2 diabetes mellitus without complications: Secondary | ICD-10-CM | POA: Diagnosis not present

## 2024-02-25 DIAGNOSIS — E119 Type 2 diabetes mellitus without complications: Secondary | ICD-10-CM | POA: Diagnosis not present

## 2024-02-25 DIAGNOSIS — Z79899 Other long term (current) drug therapy: Secondary | ICD-10-CM | POA: Diagnosis not present

## 2024-02-25 DIAGNOSIS — F32A Depression, unspecified: Secondary | ICD-10-CM | POA: Diagnosis not present

## 2024-02-25 DIAGNOSIS — J45909 Unspecified asthma, uncomplicated: Secondary | ICD-10-CM | POA: Diagnosis not present

## 2024-02-25 DIAGNOSIS — E785 Hyperlipidemia, unspecified: Secondary | ICD-10-CM | POA: Diagnosis not present

## 2024-02-25 DIAGNOSIS — N401 Enlarged prostate with lower urinary tract symptoms: Secondary | ICD-10-CM | POA: Diagnosis not present

## 2024-02-25 DIAGNOSIS — I10 Essential (primary) hypertension: Secondary | ICD-10-CM | POA: Diagnosis not present

## 2024-02-25 DIAGNOSIS — E039 Hypothyroidism, unspecified: Secondary | ICD-10-CM | POA: Diagnosis not present

## 2024-02-25 DIAGNOSIS — Z0000 Encounter for general adult medical examination without abnormal findings: Secondary | ICD-10-CM | POA: Diagnosis not present

## 2024-07-20 DIAGNOSIS — M11261 Other chondrocalcinosis, right knee: Secondary | ICD-10-CM | POA: Diagnosis not present

## 2024-07-20 DIAGNOSIS — M1711 Unilateral primary osteoarthritis, right knee: Secondary | ICD-10-CM | POA: Diagnosis not present

## 2024-07-20 DIAGNOSIS — M25561 Pain in right knee: Secondary | ICD-10-CM | POA: Diagnosis not present

## 2024-08-20 DIAGNOSIS — I10 Essential (primary) hypertension: Secondary | ICD-10-CM | POA: Diagnosis not present

## 2024-08-20 DIAGNOSIS — E119 Type 2 diabetes mellitus without complications: Secondary | ICD-10-CM | POA: Diagnosis not present

## 2024-08-25 DIAGNOSIS — E119 Type 2 diabetes mellitus without complications: Secondary | ICD-10-CM | POA: Diagnosis not present

## 2024-08-25 DIAGNOSIS — Z23 Encounter for immunization: Secondary | ICD-10-CM | POA: Diagnosis not present

## 2024-08-25 DIAGNOSIS — I10 Essential (primary) hypertension: Secondary | ICD-10-CM | POA: Diagnosis not present

## 2024-08-25 DIAGNOSIS — R351 Nocturia: Secondary | ICD-10-CM | POA: Diagnosis not present

## 2024-08-25 DIAGNOSIS — N401 Enlarged prostate with lower urinary tract symptoms: Secondary | ICD-10-CM | POA: Diagnosis not present

## 2024-08-25 DIAGNOSIS — E039 Hypothyroidism, unspecified: Secondary | ICD-10-CM | POA: Diagnosis not present

## 2024-08-25 DIAGNOSIS — Z0000 Encounter for general adult medical examination without abnormal findings: Secondary | ICD-10-CM | POA: Diagnosis not present

## 2024-08-25 DIAGNOSIS — F32A Depression, unspecified: Secondary | ICD-10-CM | POA: Diagnosis not present

## 2024-08-25 DIAGNOSIS — E785 Hyperlipidemia, unspecified: Secondary | ICD-10-CM | POA: Diagnosis not present

## 2024-10-14 DIAGNOSIS — M1712 Unilateral primary osteoarthritis, left knee: Secondary | ICD-10-CM | POA: Diagnosis not present

## 2024-10-20 DIAGNOSIS — M1712 Unilateral primary osteoarthritis, left knee: Secondary | ICD-10-CM | POA: Diagnosis not present
